# Patient Record
Sex: Male | Born: 1950 | Race: White | Hispanic: No | Marital: Married | Smoking: Current every day smoker
Health system: Southern US, Community
[De-identification: ages and names within clinical notes are randomized; demographics above are authoritative.]

## PROBLEM LIST (undated history)

## (undated) DIAGNOSIS — N400 Enlarged prostate without lower urinary tract symptoms: Secondary | ICD-10-CM

## (undated) DIAGNOSIS — I10 Essential (primary) hypertension: Secondary | ICD-10-CM

## (undated) DIAGNOSIS — E119 Type 2 diabetes mellitus without complications: Secondary | ICD-10-CM

## (undated) DIAGNOSIS — B1910 Unspecified viral hepatitis B without hepatic coma: Secondary | ICD-10-CM

## (undated) DIAGNOSIS — B182 Chronic viral hepatitis C: Secondary | ICD-10-CM

## (undated) DIAGNOSIS — C220 Liver cell carcinoma: Secondary | ICD-10-CM

## (undated) HISTORY — DX: Benign prostatic hyperplasia without lower urinary tract symptoms: N40.0

## (undated) HISTORY — PX: LIVER SURGERY: SHX698

## (undated) HISTORY — DX: Essential (primary) hypertension: I10

## (undated) HISTORY — DX: Chronic viral hepatitis C: B18.2

## (undated) HISTORY — PX: KIDNEY STONE SURGERY: SHX686

## (undated) HISTORY — DX: Unspecified viral hepatitis B without hepatic coma: B19.10

## (undated) HISTORY — DX: Liver cell carcinoma: C22.0

## (undated) HISTORY — DX: Type 2 diabetes mellitus without complications: E11.9

---

## 2003-10-18 DIAGNOSIS — L98499 Non-pressure chronic ulcer of skin of other sites with unspecified severity: Secondary | ICD-10-CM | POA: Insufficient documentation

## 2005-12-20 ENCOUNTER — Emergency Department: Payer: Self-pay | Admitting: Emergency Medicine

## 2005-12-24 ENCOUNTER — Other Ambulatory Visit: Payer: Self-pay

## 2005-12-24 ENCOUNTER — Ambulatory Visit: Payer: Self-pay | Admitting: General Practice

## 2006-10-29 ENCOUNTER — Ambulatory Visit: Payer: Self-pay | Admitting: Urology

## 2008-01-22 IMAGING — CR DG IVP HYPERTENSIVE
1 series · 8 of 10 positions shown · non-contrast
Comparison: none

REASON FOR EXAM: hematuria    kidney stone
COMMENTS:

[Series 1: view not recorded · 0.17mm/px · 8 of 15 slices shown]
[im 1/15]
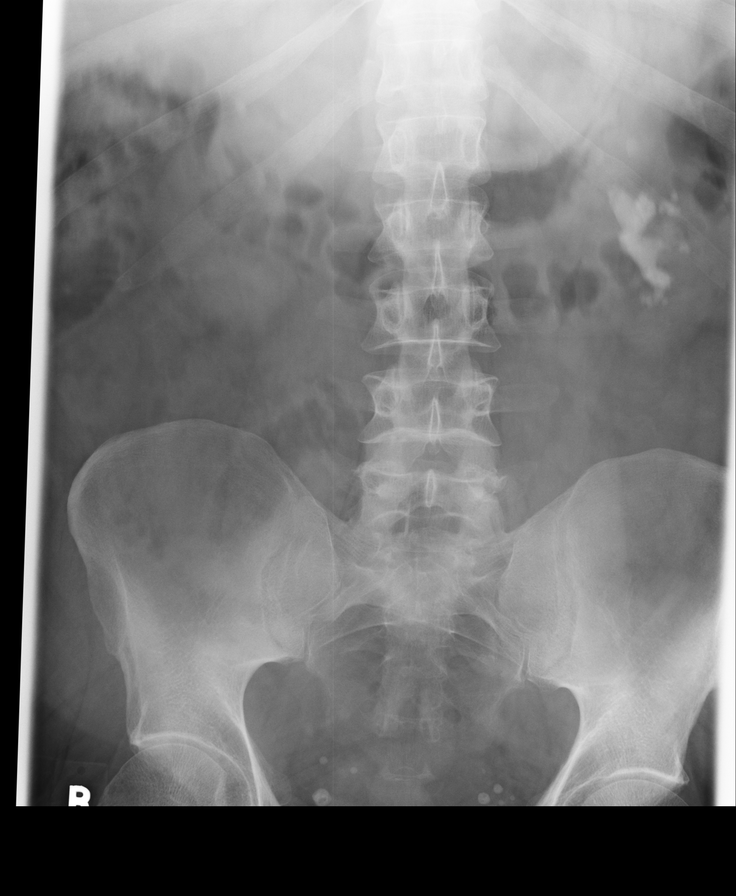
[im 2/15]
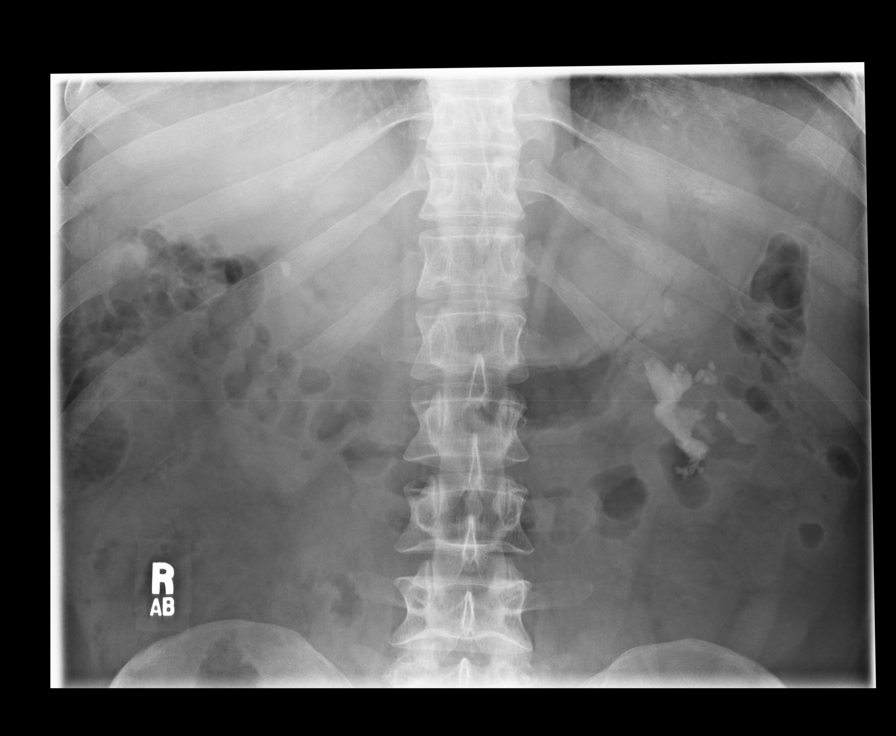
[im 4/15]
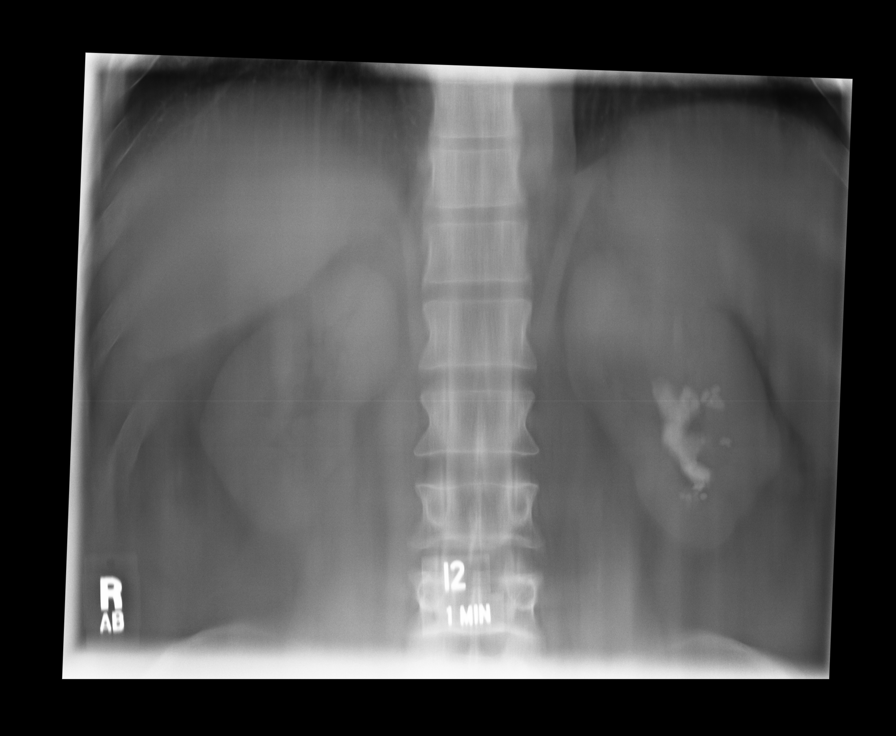
[im 5/15]
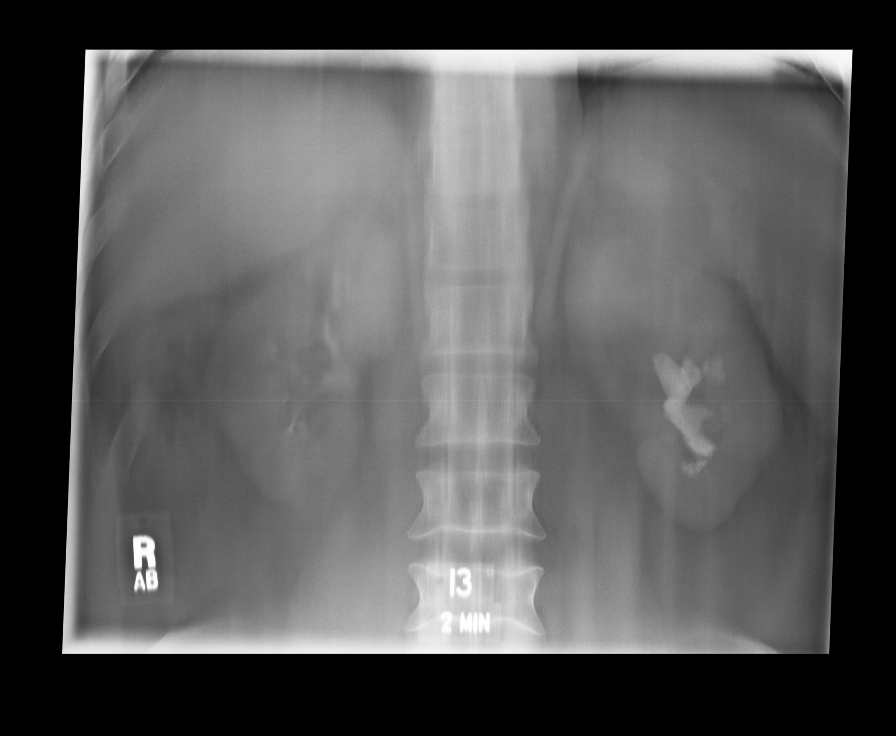
[im 7/15]
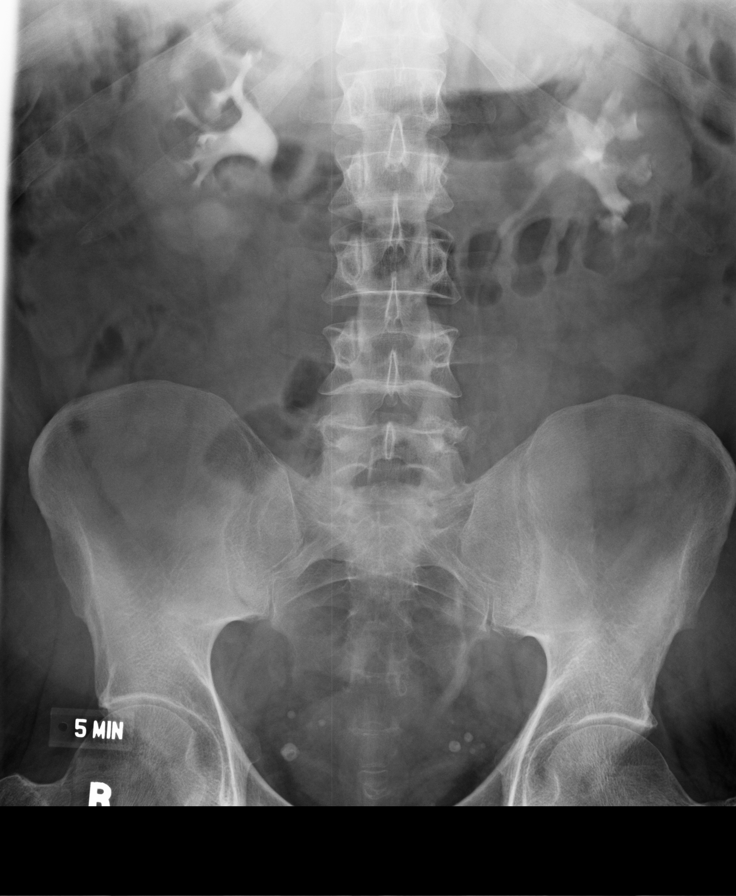
[im 8/15]
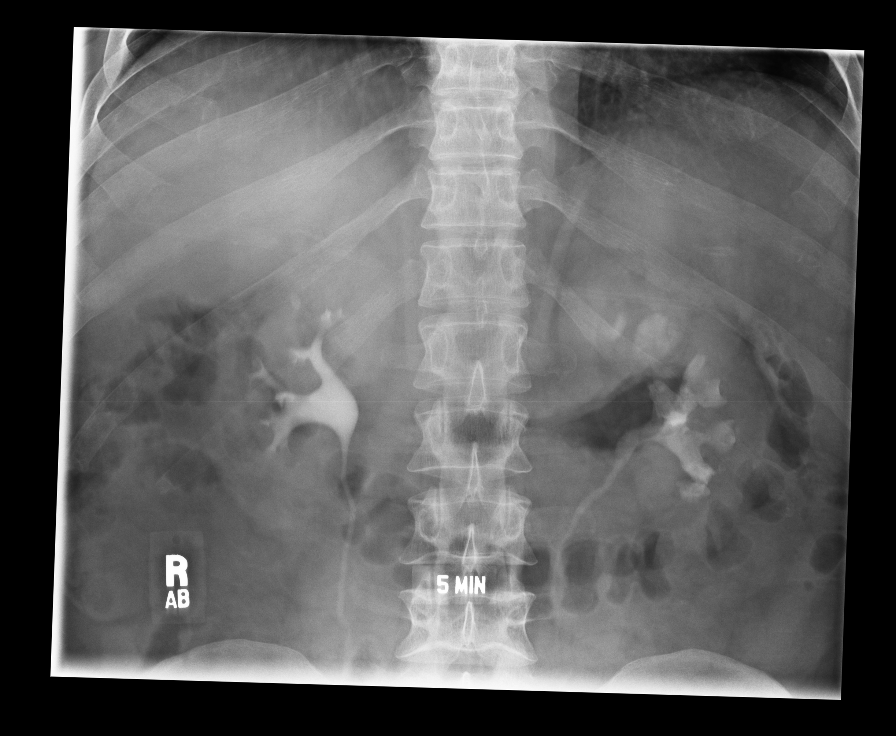
[im 10/15]
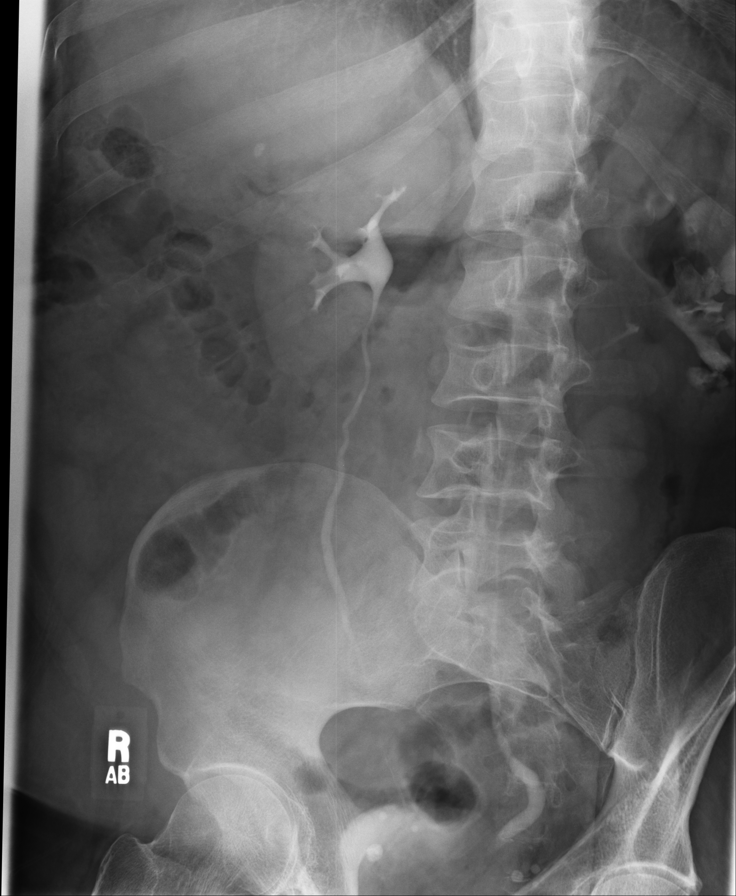
[im 11/15]
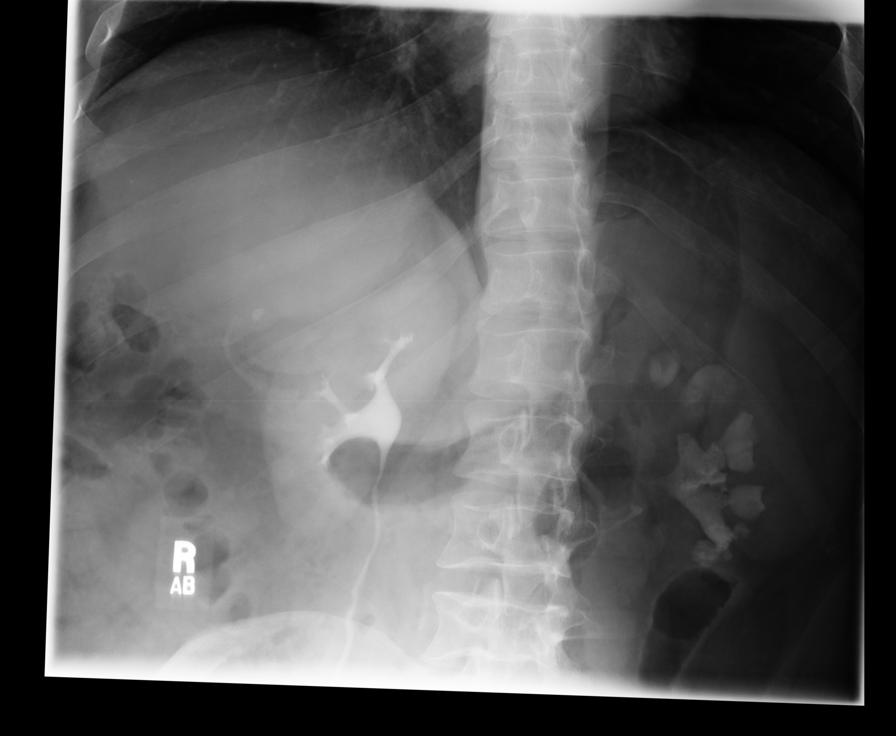

[8 of 10 positions shown; findings below may reference images not displayed]

PROCEDURE:     DXR - DXR INTRAVENOUS UROGRAPHY (IVP)  - October 29, 2006  [DATE]

RESULT:     Scout views demonstrate significant calcification over the LEFT
renal collecting system consistent with a staghorn calculus with some
additional small areas of calcification as well. There is some minimal
density over the upper pole region of the RIGHT kidney. The patient received
an injection of 50 ml of iodinated contrast. Tomographic images show the
renal contours appear to be fairly well-preserved with no definite mass
identified. Both kidneys excrete contrast. There is no evidence of
obstruction. The ureters are intermittently opacified to the bladder. Focal
density is also seen superior and lateral to the RIGHT kidney, which could
represent a gallstone. There are multiple phleboliths in the pelvic region.
The bladder fills without evidence of a filling defect. There is a small
postvoid residual volume.
IMPRESSION: 1. Staghorn calculus on the LEFT with some additional small calcifications.
2. No evidence of ureteral obstruction. There is some dilatation of the
distal portion of the LEFT ureter which is persistent. Correlation with
retrograde exam would be suggested. A definite stone is not seen distally in
the LEFT ureter.
3. Cannot exclude small radiopaque gallstone. Ultrasound may be beneficial
for further evaluation.

## 2008-04-06 DIAGNOSIS — K7460 Unspecified cirrhosis of liver: Secondary | ICD-10-CM | POA: Insufficient documentation

## 2013-02-16 DIAGNOSIS — B1910 Unspecified viral hepatitis B without hepatic coma: Secondary | ICD-10-CM | POA: Insufficient documentation

## 2013-05-02 DIAGNOSIS — C220 Liver cell carcinoma: Secondary | ICD-10-CM | POA: Insufficient documentation

## 2013-05-04 DIAGNOSIS — R509 Fever, unspecified: Secondary | ICD-10-CM | POA: Insufficient documentation

## 2013-05-05 DIAGNOSIS — N390 Urinary tract infection, site not specified: Secondary | ICD-10-CM | POA: Insufficient documentation

## 2013-05-06 DIAGNOSIS — Z944 Liver transplant status: Secondary | ICD-10-CM | POA: Insufficient documentation

## 2013-05-06 DIAGNOSIS — R7881 Bacteremia: Secondary | ICD-10-CM | POA: Insufficient documentation

## 2013-05-06 DIAGNOSIS — A4101 Sepsis due to Methicillin susceptible Staphylococcus aureus: Secondary | ICD-10-CM | POA: Insufficient documentation

## 2013-05-12 DIAGNOSIS — R339 Retention of urine, unspecified: Secondary | ICD-10-CM | POA: Insufficient documentation

## 2013-05-17 DIAGNOSIS — N401 Enlarged prostate with lower urinary tract symptoms: Secondary | ICD-10-CM | POA: Insufficient documentation

## 2014-08-01 DIAGNOSIS — E1142 Type 2 diabetes mellitus with diabetic polyneuropathy: Secondary | ICD-10-CM | POA: Insufficient documentation

## 2014-08-01 DIAGNOSIS — E119 Type 2 diabetes mellitus without complications: Secondary | ICD-10-CM | POA: Insufficient documentation

## 2014-09-21 DIAGNOSIS — Z944 Liver transplant status: Secondary | ICD-10-CM | POA: Insufficient documentation

## 2014-10-04 DIAGNOSIS — B181 Chronic viral hepatitis B without delta-agent: Secondary | ICD-10-CM | POA: Insufficient documentation

## 2014-10-18 DIAGNOSIS — R222 Localized swelling, mass and lump, trunk: Secondary | ICD-10-CM | POA: Insufficient documentation

## 2014-11-21 DIAGNOSIS — C220 Liver cell carcinoma: Secondary | ICD-10-CM | POA: Insufficient documentation

## 2015-02-07 DIAGNOSIS — F411 Generalized anxiety disorder: Secondary | ICD-10-CM | POA: Insufficient documentation

## 2015-03-23 ENCOUNTER — Encounter: Payer: Self-pay | Admitting: Oncology

## 2015-03-23 ENCOUNTER — Inpatient Hospital Stay: Payer: Medicare Other | Attending: Oncology | Admitting: Oncology

## 2015-03-23 VITALS — BP 151/65 | HR 75 | Resp 18 | Ht 70.47 in | Wt 193.1 lb

## 2015-03-23 DIAGNOSIS — N400 Enlarged prostate without lower urinary tract symptoms: Secondary | ICD-10-CM | POA: Insufficient documentation

## 2015-03-23 DIAGNOSIS — I10 Essential (primary) hypertension: Secondary | ICD-10-CM | POA: Insufficient documentation

## 2015-03-23 DIAGNOSIS — B1910 Unspecified viral hepatitis B without hepatic coma: Secondary | ICD-10-CM | POA: Diagnosis not present

## 2015-03-23 DIAGNOSIS — Z9221 Personal history of antineoplastic chemotherapy: Secondary | ICD-10-CM | POA: Diagnosis not present

## 2015-03-23 DIAGNOSIS — Z944 Liver transplant status: Secondary | ICD-10-CM | POA: Diagnosis not present

## 2015-03-23 DIAGNOSIS — C220 Liver cell carcinoma: Secondary | ICD-10-CM | POA: Diagnosis present

## 2015-03-23 DIAGNOSIS — C7951 Secondary malignant neoplasm of bone: Secondary | ICD-10-CM | POA: Diagnosis not present

## 2015-03-23 DIAGNOSIS — C7800 Secondary malignant neoplasm of unspecified lung: Secondary | ICD-10-CM | POA: Diagnosis not present

## 2015-03-23 DIAGNOSIS — Z923 Personal history of irradiation: Secondary | ICD-10-CM | POA: Diagnosis not present

## 2015-03-23 DIAGNOSIS — B182 Chronic viral hepatitis C: Secondary | ICD-10-CM | POA: Diagnosis not present

## 2015-03-23 NOTE — Progress Notes (Signed)
[facility]  [phone] [phone]  ID: Stephen Rivers OB: Aug 29, 1950  MR#: [id number]  IDP#:[id number]  No care team member to display  CHIEF COMPLAINT:  Chief Complaint  Patient presents with  . New Evaluation    wants a second opinion    INTERVAL HISTORY: Patient is a 64 year old male who is currently on hospice for metastatic hepatocellular carcinoma. He was initially diagnosed in approximately 2010 and underwent liver transplant at that time. He was in his usual state of health until he started developing chest wall pain and recent CT scan revealed widely metastatic disease in his lung as well as right rib. He has completed palliative XRT to a lesion in his right rib. He comes to clinic today to assess if there are any other treatment options. Currently, he feels well and is asymptomatic. He is not complaining of pain today. He has a good appetite and denies weight loss. He has no neurologic complaints. He denies any nausea, vomiting, consultation, or diarrhea. He has no urinary complaints. Patient otherwise feels well and offers no further specific complaints.  REVIEW OF SYSTEMS:   Review of Systems  Constitutional: Negative for weight loss and malaise/fatigue.  Respiratory: Negative.   Cardiovascular: Negative.   Gastrointestinal: Negative.   Musculoskeletal: Negative.   Neurological: Negative.  Negative for weakness.    As per HPI. Otherwise, a complete review of systems is negatve.  PAST MEDICAL HISTORY: Past Medical History  Diagnosis Date  . Diabetes mellitus   . Hepatitis B   . Hypertension   . BPH (benign prostatic hyperplasia)   . Hepatitis C, chronic   . Hepatocellular carcinoma     PAST SURGICAL HISTORY:  Liver transplant.  FAMILY HISTORY: Reviewed and unchanged. No reported history of malignancy or chronic disease.     ADVANCED DIRECTIVES:    HEALTH MAINTENANCE: Social History  Substance Use Topics  . Smoking  status: Not on file  . Smokeless tobacco: Not on file  . Alcohol Use: Not on file     Colonoscopy:  PAP:  Bone density:  Lipid panel:  Allergies  Allergen Reactions  . Aspirin     Patient states that he does not have an allergy to ASA.  Plan--double check with provider for any contraindications.  . Codeine Itching    Patient reports that he has no rash with codeine, but has itching.    Current Outpatient Prescriptions  Medication Sig Dispense Refill  . ALPRAZolam (XANAX) 1 MG tablet Take 1 mg by mouth.    . finasteride (PROSCAR) 5 MG tablet     . gabapentin (NEURONTIN) 300 MG capsule     . lisinopril (PRINIVIL,ZESTRIL) 10 MG tablet     . metFORMIN (GLUCOPHAGE) 1000 MG tablet Take 1,000 mg by mouth.    . morphine (MS CONTIN) 30 MG 12 hr tablet Take 30 mg by mouth.    . Morphine Sulfate (MORPHINE CONCENTRATE) 10 mg / 0.5 ml concentrated solution CORRECTION: MORPHINE CONCENTRATE 40mg /ml Give 20mg  (0.43ml) q 4 hours PRN pain.Hospice Patient.    . Oxycodone HCl 10 MG TABS Take 1-2 tablets (10-20mg ) every 2 hours as needed for pain.    . predniSONE (DELTASONE) 10 MG tablet TAKE 1 TABLET (10 MG TOTAL) BY MOUTH DAILY.  11  . SORAfenib (NEXAVAR) 200 MG tablet Take 400 mg by mouth.    . tamsulosin (FLOMAX) 0.4 MG CAPS capsule TAKE 1 CAPSULE EVERY DAY     No current facility-administered medications for this  visit.    OBJECTIVE: Filed Vitals:   03/23/15 1052  BP: 151/65  Pulse: 75  Resp: 18     Body mass index is 27.34 kg/(m^2).    ECOG FS:0 - Asymptomatic  General: Well-developed, well-nourished, no acute distress. Eyes: Pink conjunctiva, anicteric sclera. HEENT: Normocephalic, moist mucous membranes, clear oropharnyx. Lungs: Clear to auscultation bilaterally. Heart: Regular rate and rhythm. No rubs, murmurs, or gallops. Abdomen: Soft, nontender, nondistended. No organomegaly noted, normoactive bowel sounds. Musculoskeletal: No edema, cyanosis, or clubbing. Neuro: Alert,  answering all questions appropriately. Cranial nerves grossly intact. Skin: No rashes or petechiae noted. Psych: Normal affect. Lymphatics: No cervical, calvicular, axillary or inguinal LAD.   LAB RESULTS:  No results found for: NA, K, CL, CO2, GLUCOSE, BUN, CREATININE, CALCIUM, PROT, ALBUMIN, AST, ALT, ALKPHOS, BILITOT, GFRNONAA, GFRAA  No results found for: WBC, NEUTROABS, HGB, HCT, MCV, PLT   STUDIES: No results found.  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 Hepatocellular carcinoma: Patient is currently on hospice. He has completed XRT to the painful lesion in his right hip. Previously, sorafinib was discussed with patient, but due to financial constraints and an increased life expectancy of only 3 months, patient elected not to pursue treatment. Patient expressed understanding that  Treatment with chemotherapy have not proven to be beneficial for hepatocellular carcinoma. We will evaluate the utility of immunotherapy, but I do not believe there is any indication for this line of treatment either in [facility]. Have recommended patient continuing with hospice care. No follow-up has been scheduled.  Approximately 45 minutes was spent in discussion and consultation.  Patient expressed understanding and was in agreement with this plan. He also understands that He can call clinic at any time with any questions, concerns, or complaints.   No matching staging information was found for the patient.  Lloyd Huger, MD   03/23/2015 12:57 PM

## 2015-03-23 NOTE — Progress Notes (Signed)
Patient has received treatment at [facility] but he was told nothing else can be done and he only has 18 months to live so he is here for a 2nd opinion.

## 2015-08-28 DIAGNOSIS — R530 Neoplastic (malignant) related fatigue: Secondary | ICD-10-CM | POA: Insufficient documentation

## 2016-02-20 DIAGNOSIS — R600 Localized edema: Secondary | ICD-10-CM | POA: Insufficient documentation

## 2016-04-15 ENCOUNTER — Encounter: Payer: Self-pay | Admitting: Urology

## 2016-04-15 ENCOUNTER — Ambulatory Visit (INDEPENDENT_AMBULATORY_CARE_PROVIDER_SITE_OTHER): Payer: Medicare HMO | Admitting: Urology

## 2016-04-15 VITALS — BP 137/74 | HR 108 | Ht 70.5 in | Wt 171.0 lb

## 2016-04-15 DIAGNOSIS — R310 Gross hematuria: Secondary | ICD-10-CM | POA: Diagnosis not present

## 2016-04-15 DIAGNOSIS — R339 Retention of urine, unspecified: Secondary | ICD-10-CM | POA: Diagnosis not present

## 2016-04-15 LAB — URINALYSIS, COMPLETE
BILIRUBIN UA: NEGATIVE
GLUCOSE, UA: NEGATIVE
KETONES UA: NEGATIVE
Nitrite, UA: NEGATIVE
SPEC GRAV UA: 1.02 (ref 1.005–1.030)
Urobilinogen, Ur: 0.2 mg/dL (ref 0.2–1.0)
pH, UA: 6 (ref 5.0–7.5)

## 2016-04-15 LAB — MICROSCOPIC EXAMINATION
Bacteria, UA: NONE SEEN
RBC, UA: >30 /hpf — AB (ref 0–?)

## 2016-04-15 LAB — BLADDER SCAN AMB NON-IMAGING: SCAN RESULT: 52

## 2016-04-15 NOTE — Progress Notes (Signed)
04/15/2016 4:22 PM   Stephen Rivers 1951-07-31 [id number]  Referring provider: No referring provider defined for this encounter.  Chief Complaint  Patient presents with  . New Patient (Initial Visit)    incomplete emptying    HPI: Patient is a 65 year old Caucasian male who is was seen at [facility] emergency room for urinary retention.  Patient states that two weeks ago he was experiencing urinary dribbling that continued for a few days and then he went into acute urinary retention.   He was then seen at [facility] emergency room and was found to have 600 cc in his bladder.   He states that they attempted to catheterize him 5 times before they were successful in placing the foley.    When he returned home, the foley catheter came out.  He states it was not painful, but the balloon was intact.  He then preceded to have gross hematuria.  He then went back to the [facility] ED and no attempt was made to place any foley at that visit.  He was instructed to follow up with an urologist for a SPT.    Today, he is voiding well and is not experiencing any dysuria.  He is still seeing blood in his urine from time to time.  His PVR is 52 mL at today's exam.  Patient stated he was suffering with constipation at the time he went into retention, but he states the constipation has resolved.  He has also been on tamsulosin and finasteride for some time.  He denies taking any OTC medications or being prescribed any new medications.    He has not had any recent fevers, chills, nausea or vomiting.  He is currently under hospice care for metastatic liver cancer.    His UA today is positive for > 30 RBC's.     PMH: Past Medical History:  Diagnosis Date  . BPH (benign prostatic hyperplasia)   . Diabetes mellitus ([place])   . Hepatitis B   . Hepatitis C, chronic ([place])   . Hepatocellular carcinoma ([place])   . Hypertension     Surgical History: Past Surgical History:  Procedure Laterality Date  . KIDNEY STONE SURGERY     . LIVER SURGERY      Home Medications:    Medication List       Accurate as of 04/15/16  4:22 PM. Always use your most recent med list.          ALPRAZolam 1 MG tablet Commonly known as:  XANAX Take 1 mg by mouth.   fentaNYL 50 MCG/HR Commonly known as:  DURAGESIC - dosed mcg/hr Place 50 mcg onto the skin every 3 (three) days.   finasteride 5 MG tablet Commonly known as:  PROSCAR   gabapentin 300 MG capsule Commonly known as:  NEURONTIN   lisinopril 10 MG tablet Commonly known as:  PRINIVIL,ZESTRIL   metFORMIN 1000 MG tablet Commonly known as:  GLUCOPHAGE Take 1,000 mg by mouth.   morphine 30 MG 12 hr tablet Commonly known as:  MS CONTIN Take 30 mg by mouth.   morphine CONCENTRATE 10 mg / 0.5 ml concentrated solution CORRECTION: MORPHINE CONCENTRATE 40mg /ml Give 20mg  (0.4ml) q 4 hours PRN pain.Hospice Patient.   Oxycodone HCl 10 MG Tabs Take 1-2 tablets (10-20mg ) every 2 hours as needed for pain.   predniSONE 10 MG tablet Commonly known as:  DELTASONE TAKE 1 TABLET (10 MG TOTAL) BY MOUTH DAILY.   tamsulosin 0.4 MG Caps capsule  Commonly known as:  FLOMAX TAKE 1 CAPSULE EVERY DAY       Allergies:  Allergies  Allergen Reactions  . Aspirin     Patient states that he does not have an allergy to ASA.  Plan--double check with provider for any contraindications.  . Codeine Itching    Patient reports that he has no rash with codeine, but has itching.    Family History: Family History  Problem Relation Age of Onset  . Prostate cancer Neg Hx   . Bladder Cancer Neg Hx   . Kidney cancer Neg Hx     Social History:  reports that he has been smoking Cigarettes.  He has been smoking about 1.00 pack per day. He has never used smokeless tobacco. He reports that he does not drink alcohol or use drugs.  ROS: UROLOGY Frequent Urination?: Yes Hard to postpone urination?: No Burning/pain with urination?: Yes Get up at night to urinate?: Yes Leakage of urine?:  Yes Urine stream starts and stops?: Yes Trouble starting stream?: Yes Do you have to strain to urinate?: Yes Blood in urine?: Yes Urinary tract infection?: No Sexually transmitted disease?: No Injury to kidneys or bladder?: No Painful intercourse?: No Weak stream?: Yes Erection problems?: Yes Penile pain?: Yes  Gastrointestinal Nausea?: Yes Vomiting?: No Indigestion/heartburn?: No Diarrhea?: Yes Constipation?: No  Constitutional Fever: No Night sweats?: No Weight loss?: Yes Fatigue?: Yes  Skin Skin rash/lesions?: Yes Itching?: Yes  Eyes Blurred vision?: Yes Double vision?: No  Ears/Nose/Throat Sore throat?: No Sinus problems?: Yes  Hematologic/Lymphatic Swollen glands?: No Easy bruising?: Yes  Cardiovascular Leg swelling?: Yes Chest pain?: No  Respiratory Cough?: Yes Shortness of breath?: Yes  Endocrine Excessive thirst?: Yes  Musculoskeletal Back pain?: No Joint pain?: Yes  Neurological Headaches?: No Dizziness?: No  Psychologic Depression?: Yes Anxiety?: Yes  Physical Exam: BP 137/74 (BP Location: Left Arm, Patient Position: Sitting, Cuff Size: Normal)   Pulse (!) 108   Ht 5' 10.5" (1.791 m)   Wt 171 lb (77.6 kg)   BMI 24.19 kg/m   Constitutional: Well nourished. Alert and oriented, No acute distress. HEENT: [place] AT, moist mucus membranes. Trachea midline, no masses. Cardiovascular: No clubbing, cyanosis, or edema. Respiratory: Normal respiratory effort, no increased work of breathing. GI: Abdomen is soft, non tender, non distended, no abdominal masses. Liver and spleen not palpable.  No hernias appreciated.  Stool sample for occult testing is not indicated.   GU: No CVA tenderness.  No bladder fullness or masses.  Patient with circumcised phallus.  Urethral meatus is patent.  No penile discharge. No penile lesions or rashes. Scrotum without lesions, cysts, rashes and/or edema.  Testicles are located scrotally bilaterally. No masses are  appreciated in the testicles. Left and right epididymis are normal. Rectal: Patient with  normal sphincter tone. Anus and perineum without scarring or rashes. No rectal masses are appreciated. Prostate is approximately 45 grams, no nodules are appreciated. Seminal vesicles are normal. Skin: No rashes, bruises or suspicious lesions. Lymph: No cervical or inguinal adenopathy. Neurologic: Grossly intact, no focal deficits, moving all 4 extremities. Psychiatric: Normal mood and affect.  Urinalysis > 30 RBC's/hpf.  See EPIC.  Pertinent Imaging: Results for DAYMEN, KOSAKOWSKI (MRN [id number]) as of 04/15/2016 12:00  Ref. Range 04/15/2016 11:26  Scan Result Unknown [id number]    Assessment & Plan:    1. Incomplete bladder emptying  - continue the tamsulosin and finasteride  - report to the office if having difficulty urinating  - offered CIC  instruction, but patient was not willing  - Bladder Scan (Post Void Residual) in office  - patient to RTC in 2 weeks for PVR and IPSS  2. Gross hematuria  - due to traumatic foley removal  - Urinalysis, Complete  - continue observation  - possible risk for stricture in the future  Return in about 2 weeks (around 04/29/2016) for PVR and IPSS .  These notes generated with voice recognition software. I apologize for typographical errors.  Zara Council, [facility] [address], [place], [place] [address] [phone]

## 2016-05-01 ENCOUNTER — Ambulatory Visit: Payer: Medicare HMO | Admitting: Urology

## 2016-08-11 DEATH — deceased
